# Patient Record
Sex: Male | Born: 1935 | Race: White | Hispanic: No | Marital: Single | Smoking: Never smoker
Health system: Southern US, Community
[De-identification: ages and names within clinical notes are randomized; demographics above are authoritative.]

## PROBLEM LIST (undated history)

## (undated) DIAGNOSIS — I10 Essential (primary) hypertension: Secondary | ICD-10-CM

## (undated) DIAGNOSIS — E785 Hyperlipidemia, unspecified: Secondary | ICD-10-CM

## (undated) DIAGNOSIS — I509 Heart failure, unspecified: Secondary | ICD-10-CM

---

## 2012-11-26 ENCOUNTER — Emergency Department: Payer: Self-pay | Admitting: Unknown Physician Specialty

## 2012-11-26 LAB — COMPREHENSIVE METABOLIC PANEL WITH GFR
Albumin: 3.4 g/dL
Alkaline Phosphatase: 125 U/L
Anion Gap: 6 — ABNORMAL LOW
BUN: 14 mg/dL
Bilirubin,Total: 0.2 mg/dL
Calcium, Total: 8.7 mg/dL
Chloride: 101 mmol/L
Co2: 30 mmol/L
Creatinine: 0.76 mg/dL
EGFR (African American): >60
EGFR (Non-African Amer.): >60
Glucose: 116 mg/dL — ABNORMAL HIGH
Osmolality: 275
Potassium: 3.4 mmol/L — ABNORMAL LOW
SGOT(AST): 34 U/L
SGPT (ALT): 27 U/L
Sodium: 137 mmol/L
Total Protein: 8.2 g/dL

## 2012-11-26 LAB — CBC
HGB: 10.5 g/dL — ABNORMAL LOW (ref 13.0–18.0)
MCH: 29 pg (ref 26.0–34.0)
MCV: 89 fL (ref 80–100)
RBC: 3.64 10*6/uL — ABNORMAL LOW (ref 4.40–5.90)
WBC: 3.5 10*3/uL — ABNORMAL LOW (ref 3.8–10.6)

## 2012-11-26 LAB — PROTIME-INR
INR: 1
Prothrombin Time: 13.3 s

## 2012-11-26 LAB — APTT: Activated PTT: 31.6 secs (ref 23.6–35.9)

## 2012-11-26 LAB — CK TOTAL AND CKMB (NOT AT ARMC)
CK, Total: 94 U/L
CK-MB: 1.5 ng/mL

## 2012-11-28 LAB — URINALYSIS, COMPLETE
Bacteria: NONE SEEN
Glucose,UR: NEGATIVE mg/dL (ref 0–75)
Leukocyte Esterase: NEGATIVE
Ph: 5 (ref 4.5–8.0)
Protein: NEGATIVE
Squamous Epithelial: 3
WBC UR: 3 /HPF (ref 0–5)

## 2013-07-12 ENCOUNTER — Ambulatory Visit: Payer: Self-pay | Admitting: Family Medicine

## 2014-02-10 ENCOUNTER — Ambulatory Visit: Payer: Self-pay | Admitting: Family Medicine

## 2014-02-21 ENCOUNTER — Ambulatory Visit: Payer: Self-pay | Admitting: Family Medicine

## 2014-03-28 ENCOUNTER — Emergency Department: Payer: Self-pay | Admitting: Emergency Medicine

## 2014-06-05 ENCOUNTER — Other Ambulatory Visit: Payer: Self-pay | Admitting: Urgent Care

## 2014-06-05 LAB — COMPREHENSIVE METABOLIC PANEL
ALT: 24 U/L
ANION GAP: 7 (ref 7–16)
AST: 26 U/L (ref 15–37)
Albumin: 3.4 g/dL (ref 3.4–5.0)
Alkaline Phosphatase: 157 U/L — ABNORMAL HIGH
BUN: 14 mg/dL (ref 7–18)
Bilirubin,Total: 0.3 mg/dL (ref 0.2–1.0)
CHLORIDE: 99 mmol/L (ref 98–107)
CREATININE: 0.97 mg/dL (ref 0.60–1.30)
Calcium, Total: 9 mg/dL (ref 8.5–10.1)
Co2: 32 mmol/L (ref 21–32)
EGFR (African American): >60
EGFR (Non-African Amer.): >60
Glucose: 171 mg/dL — ABNORMAL HIGH (ref 65–99)
Osmolality: 280 (ref 275–301)
Potassium: 3.7 mmol/L (ref 3.5–5.1)
Sodium: 138 mmol/L (ref 136–145)
TOTAL PROTEIN: 7.8 g/dL (ref 6.4–8.2)

## 2014-06-05 LAB — CBC WITH DIFFERENTIAL/PLATELET
BASOS ABS: 0 10*3/uL (ref 0.0–0.1)
BASOS PCT: 0.7 %
EOS PCT: 4.2 %
Eosinophil #: 0.2 10*3/uL (ref 0.0–0.7)
HCT: 38.5 % — ABNORMAL LOW (ref 40.0–52.0)
HGB: 12.3 g/dL — ABNORMAL LOW (ref 13.0–18.0)
LYMPHS ABS: 1.8 10*3/uL (ref 1.0–3.6)
LYMPHS PCT: 30.2 %
MCH: 29.8 pg (ref 26.0–34.0)
MCHC: 31.8 g/dL — AB (ref 32.0–36.0)
MCV: 94 fL (ref 80–100)
Monocyte #: 0.7 x10 3/mm (ref 0.2–1.0)
Monocyte %: 12.4 %
NEUTROS ABS: 3.1 10*3/uL (ref 1.4–6.5)
Neutrophil %: 52.5 %
Platelet: 227 10*3/uL (ref 150–440)
RBC: 4.11 10*6/uL — AB (ref 4.40–5.90)
RDW: 12.9 % (ref 11.5–14.5)
WBC: 6 10*3/uL (ref 3.8–10.6)

## 2014-06-05 LAB — TSH: Thyroid Stimulating Horm: 2.67 u[IU]/mL

## 2015-03-19 ENCOUNTER — Emergency Department
Admission: EM | Admit: 2015-03-19 | Discharge: 2015-03-20 | Disposition: A | Discharge status: Other Acute Inpatient Hospital | Payer: Commercial Managed Care - HMO | Attending: Emergency Medicine | Admitting: Emergency Medicine

## 2015-03-19 ENCOUNTER — Emergency Department: Payer: Commercial Managed Care - HMO

## 2015-03-19 DIAGNOSIS — S129XXA Fracture of neck, unspecified, initial encounter: Secondary | ICD-10-CM

## 2015-03-19 DIAGNOSIS — Z79899 Other long term (current) drug therapy: Secondary | ICD-10-CM | POA: Diagnosis not present

## 2015-03-19 DIAGNOSIS — Y9389 Activity, other specified: Secondary | ICD-10-CM | POA: Insufficient documentation

## 2015-03-19 DIAGNOSIS — S0990XA Unspecified injury of head, initial encounter: Secondary | ICD-10-CM | POA: Insufficient documentation

## 2015-03-19 DIAGNOSIS — Z7982 Long term (current) use of aspirin: Secondary | ICD-10-CM | POA: Insufficient documentation

## 2015-03-19 DIAGNOSIS — Y9289 Other specified places as the place of occurrence of the external cause: Secondary | ICD-10-CM | POA: Diagnosis not present

## 2015-03-19 DIAGNOSIS — Y998 Other external cause status: Secondary | ICD-10-CM | POA: Insufficient documentation

## 2015-03-19 DIAGNOSIS — R531 Weakness: Secondary | ICD-10-CM | POA: Diagnosis not present

## 2015-03-19 DIAGNOSIS — S12112A Nondisplaced Type II dens fracture, initial encounter for closed fracture: Secondary | ICD-10-CM | POA: Diagnosis not present

## 2015-03-19 DIAGNOSIS — W01198A Fall on same level from slipping, tripping and stumbling with subsequent striking against other object, initial encounter: Secondary | ICD-10-CM | POA: Diagnosis not present

## 2015-03-19 DIAGNOSIS — S00432A Contusion of left ear, initial encounter: Secondary | ICD-10-CM | POA: Diagnosis not present

## 2015-03-19 DIAGNOSIS — I10 Essential (primary) hypertension: Secondary | ICD-10-CM | POA: Insufficient documentation

## 2015-03-19 DIAGNOSIS — S199XXA Unspecified injury of neck, initial encounter: Secondary | ICD-10-CM | POA: Diagnosis present

## 2015-03-19 DIAGNOSIS — W19XXXA Unspecified fall, initial encounter: Secondary | ICD-10-CM

## 2015-03-19 NOTE — ED Notes (Signed)
Pt fell backwards onto the closet door, co head pain, neck pain, left ear with bruising and blood noted, and swelling to left side of neck.

## 2015-03-19 NOTE — ED Provider Notes (Signed)
[facility] Emergency Department Provider Note  ____________________________________________  Time seen: Approximately 11:22 PM  I have reviewed the triage vital signs and the nursing notes.   HISTORY  Chief Complaint Fall    HPI Steven Nolan is a 81 y.o. male who presents s/p mechanical fall approximately 8:30 PM. Patient lost control of his walker and fell backwards into his closet door, striking left head. Subsequently patient complained of neck pain to his daughter who brought patient to the ED for evaluation. Patient denies LOC. Complains of6/10 posterior neck and head pain. Denies numbness, tingling, incontinence, weakness.   Past medical history Hypertension Hyperlipidemia CVA w/ residual left side deficits    No past surgical history on file.  Current Outpatient Rx  Name  Route  Sig  Dispense  Refill  . aspirin EC 81 MG tablet   Oral   Take 81 mg by mouth daily.         . bifidobacterium infantis (ALIGN) capsule   Oral   Take 1 capsule by mouth daily.         . furosemide (LASIX) 20 MG tablet   Oral   Take 20 mg by mouth daily.         Marland Kitchen. lisinopril (PRINIVIL,ZESTRIL) 20 MG tablet   Oral   Take 20 mg by mouth daily.         Marland Kitchen. lovastatin (MEVACOR) 10 MG tablet   Oral   Take 10 mg by mouth at bedtime.         . metoprolol tartrate (LOPRESSOR) 25 MG tablet   Oral   Take 25 mg by mouth 2 (two) times daily.         . potassium chloride (KLOR-CON) 8 MEQ tablet   Oral   Take 8 mEq by mouth daily.           Allergies Review of patient's allergies indicates no known allergies.  No family history on file.  Social History History  Substance Use Topics  . Smoking status: Not on file  . Smokeless tobacco: Not on file  . Alcohol Use: Not on file  Nonsmoker Former EtOH  Review of Systems Constitutional: No fever/chills Eyes: No visual changes. ENT: Positive for left ear pain. Positive for neck pain. No  sore throat.  Cardiovascular: Denies chest pain. Respiratory: Denies shortness of breath. Gastrointestinal: No abdominal pain.  No nausea, no vomiting.  No diarrhea.  No constipation. Genitourinary: Negative for dysuria. Musculoskeletal: Negative for back pain. Skin: Negative for rash. Neurological: Positive for head pain. Negative for focal weakness or numbness.  10-point ROS otherwise negative.  ____________________________________________   PHYSICAL EXAM:  VITAL SIGNS: ED Triage Vitals  Enc Vitals Group     BP 03/19/15 2148 168/60 mmHg     Pulse Rate 03/19/15 2148 63     Resp 03/19/15 2148 18     Temp 03/19/15 2148 97.6 F (36.4 C)     Temp Source 03/19/15 2148 Oral     SpO2 03/19/15 2148 100 %     Weight 03/19/15 2148 165 lb (74.844 kg)     Height 03/19/15 2148 5\' 10"  (1.778 m)     Head Cir --      Peak Flow --      Pain Score 03/19/15 2148 5     Pain Loc --      Pain Edu? --      Excl. in GC? --     Constitutional: Alert and oriented. Well  appearing and in no acute distress. Eyes: Conjunctivae are normal. PERRL. EOMI. Head: Left ear abrasion with hematoma. Nose: No congestion/rhinnorhea. Mouth/Throat: Mucous membranes are moist.  Oropharynx non-erythematous. Neck: No stridor. Cervical spine tenderness to palpation. Midline trachea. Cardiovascular: Normal rate, regular rhythm. Grossly normal heart sounds.  Good peripheral circulation. Respiratory: Normal respiratory effort.  No retractions. Lungs CTAB. Gastrointestinal: Soft and nontender. No distention. No abdominal bruits. No CVA tenderness. Musculoskeletal: No lower extremity tenderness nor edema.  No joint effusions. Neurologic:  Normal speech and language. No gross focal neurologic deficits are appreciated; patient has baseline left lower leg 4/5 motor strength. Speech is normal. Gait not tested. Skin:  Skin is warm, dry and intact. No rash noted. Psychiatric: Mood and affect are normal. Speech and behavior  are normal.  ____________________________________________   LABS (all labs ordered are listed, but only abnormal results are displayed)  Labs Reviewed - No data to display ____________________________________________  EKG  None ____________________________________________  RADIOLOGY  CT head and cervical spine without contrast interpreted per Dr. Grace Isaac: Cervical spine CT Impression:  1. Acute nondisplaced type 2 odontoid fracture without associated retropulsion. 2. 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 3. Moderate to severe multilevel cervical spine DDD, worse at C5-C6. 4. Extensive calcified plaque within the bilateral carotid bulbs. Further evaluation with nonemergent carotid Doppler ultrasound could be performed as clinically indicated. Head CT Impression:  1. No acute intracranial process. 2. Advanced microvascular ischemic disease with old large right basal ganglia infarct.   ____________________________________________   PROCEDURES  Procedure(s) performed: None  Critical Care performed: No  ____________________________________________   INITIAL IMPRESSION / ASSESSMENT AND PLAN / ED COURSE  Pertinent labs & imaging results that were available during my care of the patient were reviewed by me and considered in my medical decision making (see chart for details).  81 year old male s/p mechanical fall with nondisplaced type II odontoid fracture without neurological deficits. Call placed to [facility] transfer center; patient accepted to the ED by Dr. Matilde Haymaker. Patient and daughter updated and agree with plan.  ----------------------------------------- 1:08 AM on 03/20/2015 -----------------------------------------  Patient was log-rolled onto long spine board for transport. Moving all extremities; no complaints of numbness or tingling at discharge. ____________________________________________   FINAL CLINICAL IMPRESSION(S) / ED DIAGNOSES  Final  diagnoses:  Fall, initial encounter  Neck fracture, initial encounter  Ear hematoma, left, initial encounter      Irean Hong, MD 03/20/15 0109

## 2015-03-20 ENCOUNTER — Other Ambulatory Visit: Payer: Self-pay

## 2015-03-20 ENCOUNTER — Emergency Department
Admission: EM | Admit: 2015-03-20 | Discharge: 2015-03-20 | Disposition: A | Discharge status: Home / Self Care | Payer: Commercial Managed Care - HMO | Attending: Emergency Medicine | Admitting: Emergency Medicine

## 2015-03-20 DIAGNOSIS — R200 Anesthesia of skin: Secondary | ICD-10-CM | POA: Diagnosis not present

## 2015-03-20 DIAGNOSIS — Z7982 Long term (current) use of aspirin: Secondary | ICD-10-CM | POA: Insufficient documentation

## 2015-03-20 DIAGNOSIS — Z79899 Other long term (current) drug therapy: Secondary | ICD-10-CM | POA: Insufficient documentation

## 2015-03-20 DIAGNOSIS — I10 Essential (primary) hypertension: Secondary | ICD-10-CM | POA: Diagnosis not present

## 2015-03-20 DIAGNOSIS — R531 Weakness: Secondary | ICD-10-CM | POA: Diagnosis present

## 2015-03-20 HISTORY — DX: Essential (primary) hypertension: I10

## 2015-03-20 HISTORY — DX: Heart failure, unspecified: I50.9

## 2015-03-20 HISTORY — DX: Hyperlipidemia, unspecified: E78.5

## 2015-03-20 LAB — BASIC METABOLIC PANEL
Anion gap: 10 (ref 5–15)
BUN: 11 mg/dL (ref 6–20)
CO2: 29 mmol/L (ref 22–32)
CREATININE: 0.97 mg/dL (ref 0.61–1.24)
Calcium: 9.4 mg/dL (ref 8.9–10.3)
Chloride: 97 mmol/L — ABNORMAL LOW (ref 101–111)
GFR calc non Af Amer: >60 mL/min (ref >60)
Glucose, Bld: 150 mg/dL — ABNORMAL HIGH (ref 65–99)
POTASSIUM: 4.1 mmol/L (ref 3.5–5.1)
SODIUM: 136 mmol/L (ref 135–145)

## 2015-03-20 LAB — CBC
HEMATOCRIT: 37.3 % — AB (ref 40.0–52.0)
Hemoglobin: 12.2 g/dL — ABNORMAL LOW (ref 13.0–18.0)
MCH: 30.3 pg (ref 26.0–34.0)
MCHC: 32.8 g/dL (ref 32.0–36.0)
MCV: 92.3 fL (ref 80.0–100.0)
Platelets: 233 10*3/uL (ref 150–440)
RBC: 4.04 MIL/uL — ABNORMAL LOW (ref 4.40–5.90)
RDW: 12.7 % (ref 11.5–14.5)
WBC: 7.9 10*3/uL (ref 3.8–10.6)

## 2015-03-20 LAB — TROPONIN I
Troponin I: 0.03 ng/mL (ref <0.031)
Troponin I: <0.03 ng/mL (ref <0.031)

## 2015-03-20 NOTE — ED Notes (Signed)
Pt awaiting PT consult

## 2015-03-20 NOTE — Evaluation (Signed)
Physical Therapy Evaluation Patient Details Name: MOHANNAD OLIVERO MRN: [id number] DOB: Mar 20, 1932 Today's Date: 03/20/2015   History of Present Illness  presented to ER after acute onset of generalized weakness, diaphoresis and numbness x15 minutes.  Of note, patient seen previous day (5/26) in ER status post fall with non-displaced, type-2 odontoid fracture (no retropulsion); transferred to [facility] and discharged home with hard-cervical collar this AM (around 0630).    Clinical Impression  Upon evaluation, patient alert and oriented to all basic information; hyperverbal, requiring intermittent redirection to task.  Demonstrates chronic L UE/LE hemiparesis from previous CVA, unchanged with this event, and grossly [facility] for basic transfers/mobility.  Currently requiring min/mod assist for bed mobility; cga/min assist for sit/stand, basic transfers and gait (30') with RW.  No overt buckling or LOB. Vitals stable and WFL throughout session; no orthostasis.  Denies pain.  Hard cervical collar donned throughout session. Would benefit from skilled PT to address above deficits and promote optimal return to PLOF; Recommend transition to home HHPT/OT upon discharge from acute hospitalization. Patient/family aware of recommendations and in agreement with plan.     Follow Up Recommendations Home health PT [facility])    Equipment Recommendations       Recommendations for Other Services OT consult     Precautions / Restrictions Precautions Precautions: Fall Required Braces or Orthoses: Cervical Brace Cervical Brace: Hard collar;At all times Restrictions Weight Bearing Restrictions: No      Mobility  Bed Mobility Overal bed mobility: Needs Assistance Bed Mobility: Supine to Sit;Sit to Supine     Supine to sit: Min assist Sit to supine: Mod assist      Transfers Overall transfer level: Needs assistance Equipment used: Rolling walker (2 wheeled) Transfers: Sit to/from Stand Sit to Stand: Min  guard;Min assist         General transfer comment: tends to maintain L LE slightly anterior to BOS  Ambulation/Gait Ambulation/Gait assistance: Min guard;Min assist Ambulation Distance (Feet): 30 Feet Assistive device: Rolling walker (2 wheeled)       General Gait Details: 3-point, step to gait pattern; decreased stance time/weight acceptance to L LE.  Decreased L LE heel strike/toe off.  Good UE grasp on RW. No overt buckling/LOB.  Stairs            Wheelchair Mobility    Modified Rankin (Stroke Patients Only)       Balance Overall balance assessment: Needs assistance Sitting-balance support: No upper extremity supported;Feet supported Sitting balance-Leahy Scale: Good     Standing balance support: Bilateral upper extremity supported Standing balance-Leahy Scale: Fair                               Pertinent Vitals/Pain Pain Assessment: No/denies pain    Home Living Family/patient expects to be discharged to:: Private residence Living Arrangements: Children Available Help at Discharge: Family Type of Home: Apartment Home Access: Level entry     Home Layout: One level Home Equipment: Environmental consultant - 2 wheels;Tub bench;Grab bars - tub/shower      Prior Function Level of Independence: Independent with assistive device(s)               Hand Dominance   Dominant Hand: Left    Extremity/Trunk Assessment   Upper Extremity Assessment:  (R UE grossly WFL for strength and ROM; L UE globally at least 3 to 4/5, delayed speed of activation/coordination.  Denies sensory deficit.  All shoulder elevation limited  to shoulder-height due to cervical injury.)           Lower Extremity Assessment:  (R LE grossly WFL for strength and ROM.  L LE grossly 4/5 throughout.  Denies sensory defici; + Babinski L LE.)         Communication   Communication: No difficulties  Cognition Arousal/Alertness: Awake/alert Behavior During Therapy: WFL for tasks  assessed/performed Overall Cognitive Status: Within Functional Limits for tasks assessed                      General Comments      Exercises        Assessment/Plan    PT Assessment Patient needs continued PT services  PT Diagnosis Difficulty walking;Generalized weakness   PT Problem List Decreased strength;Decreased range of motion;Decreased activity tolerance;Decreased balance;Decreased mobility;Decreased coordination;Decreased cognition;Decreased knowledge of use of DME;Decreased safety awareness;Decreased knowledge of precautions;Pain  PT Treatment Interventions DME instruction;Gait training;Stair training;Functional mobility training;Therapeutic activities;Therapeutic exercise;Balance training;Patient/family education   PT Goals (Current goals can be found in the Care Plan section) Acute Rehab PT Goals Patient Stated Goal: "to go back home" PT Goal Formulation: With patient/family Time For Goal Achievement: 04/03/15 Potential to Achieve Goals: Good    Frequency Min 2X/week   Barriers to discharge        Co-evaluation               End of Session Equipment Utilized During Treatment: Gait belt Activity Tolerance: Patient tolerated treatment well Patient left: in bed;with family/visitor present Nurse Communication: Mobility status    Functional Limitation: Mobility: Walking and moving around Mobility: Walking and Moving Around Current Status [phone]): At least 20 percent but less than 40 percent impaired, limited or restricted Mobility: Walking and Moving Around Goal Status [phone](G8979): At least 1 percent but less than 20 percent impaired, limited or restricted    Time: [id number] PT Time Calculation (min) (ACUTE ONLY): 32 min   Charges:   PT Evaluation $Initial PT Evaluation Tier I: 1 Procedure     PT G Codes:   PT G-Codes **NOT FOR INPATIENT CLASS** Functional Limitation: Mobility: Walking and moving around Mobility: Walking and Moving Around Current  Status [id number]): At least 20 percent but less than 40 percent impaired, limited or restricted Mobility: Walking and Moving Around Goal Status [phone]([id number]): At least 1 percent but less than 20 percent impaired, limited or restricted    Nikoleta Dady H. Manson PasseyBrown, PT, DPT 03/20/2015, 2:23 PM [phone]

## 2015-03-20 NOTE — Care Management Note (Signed)
Case Management Note  Patient Details  Name: Steven Nolan MRN: [id number] Date of Birth: 28-Jul-1932  Subjective/Objective:                    Action/Plan:Spoke to daughter Vena Austrialeanor in person at bedside. [facility] and was on hold for   Too long and hung up. She does have the number for a case manager she has worked with before, and she is going to consult with her about other  services  The pt may benefit from.   Expected Discharge Date:                  Expected Discharge Plan:     In-House Referral:     Discharge planning Services     Post Acute Care Choice:    Choice offered to:     DME Arranged:    DME Agency:     HH Arranged:    HH Agency:     Status of Service:     Medicare Important Message Given:    Date Medicare IM Given:    Medicare IM give by:    Date Additional Medicare IM Given:    Additional Medicare Important Message give by:     If discussed at Long Length of Stay Meetings, dates discussed:    Additional Comments:  Berna BueCheryl Tamia Dial, RN 03/20/2015, 2:49 PM

## 2015-03-20 NOTE — ED Notes (Signed)
Pt given drink and snack.

## 2015-03-20 NOTE — ED Notes (Signed)
Pt seen in ER yesterday after mechanical fall, dx with cervical [facility]x [facility]. While at home, pt in bathroom and had sudden onset of diaphoresis, chest "numbness" and weakness. Pt could not move without assistance. Sx lasted approx 15 minutes and have subsided at this time. Pt brought in by daughter who was with patient when sx occurred. Pt wearing custom c collar that he was instructed to wear for 6 weeks at this time. Pt alert and oriented X4, active, cooperative, pt in NAD. RR even and unlabored, color WNL.  Pt at baseline per family.

## 2015-03-20 NOTE — ED Notes (Signed)
MD at bedside. 

## 2015-03-20 NOTE — Discharge Instructions (Signed)
Weakness Weakness is a lack of strength. You may feel weak all over your body or just in one part of your body. Weakness can be serious. In some cases, you may need more medical tests. HOME CARE  Rest.  Eat a well-balanced diet.  Try to exercise every day.  Only take medicines as told by your doctor. GET HELP RIGHT AWAY IF:   You cannot do your normal daily activities.  You cannot walk up and down stairs, or you feel very tired when you do so.  You have shortness of breath or chest pain.  You have trouble moving parts of your body.  You have weakness in only one body part or on only one side of the body.  You have a fever.  You have trouble speaking or swallowing.  You cannot control when you pee (urinate) or poop (bowel movement).  You have black or bloody throw up (vomit) or poop.  Your weakness gets worse or spreads to other body parts.  You have new aches or pains. MAKE SURE YOU:   Understand these instructions.  Will watch your condition.  Will get help right away if you are not doing well or get worse. Document Released: 09/22/2008 Document Revised: 04/10/2012 Document Reviewed: 12/09/2011 [facility] Patient Information 2015 [employer] [place]. This information is not intended to replace advice given to you by your health care provider. Make sure you discuss any questions you have with your health care provider.     Please return to the emergency department for any chest pain, tightness, pressure, shortness of breath/difficulty breathing, or any other personally concerning symptom. Please follow up with her primary care doctor soon as possible regarding today's ER visit. Please follow-up with your primary care doctor regarding home health care.

## 2015-03-20 NOTE — Care Management Note (Signed)
Case Management Note  Patient Details  Name: Steven Nolan MRN: [id number] Date of Birth: 08/28/1932  Subjective/Objective:   Pt. Here after being seen yesterday, [facility]. Daughter wants home health to assist at home per Dr.Paduchowski.  I have contacted [facility] via Tiffany -[phone]; to arrange home PT, and the MD has agreed to complete the face to face and enter orders for home PT.                  Action/Plan:   Expected Discharge Date:                  Expected Discharge Plan:     In-House Referral:     Discharge planning Services     Post Acute Care Choice:    Choice offered to:     DME Arranged:    DME Agency:     HH Arranged:    HH Agency:     Status of Service:     Medicare Important Message Given:    Date Medicare IM Given:    Medicare IM give by:    Date Additional Medicare IM Given:    Additional Medicare Important Message give by:     If discussed at Long Length of Stay Meetings, dates discussed:    Additional Comments:  Berna BueCheryl Haven Pylant, RN 03/20/2015, 10:42 AM

## 2015-03-20 NOTE — ED Notes (Signed)
Pt states he fell and was seen here yesterday and dx with cervical fx and [facility], daughter states they called her at 6am and he was discharged from there..states around 8-830am this morning he had sudden onset generalized weakness, diaphoresis and numbness to the chest area that lasted about 15 min.Marland Kitchen.denies sx at present..Marland Kitchen

## 2015-03-20 NOTE — ED Notes (Signed)
Pt alert and oriented X4, active, cooperative, pt in NAD. RR even and unlabored, color WNL.  Pt family informed to return with patient if any life threatening symptoms occur. Following up with Home Care

## 2015-03-20 NOTE — ED Provider Notes (Signed)
[facility] Emergency Department Provider Note  Time seen: 10:41 AM  I have reviewed the triage vital signs and the nursing notes.   HISTORY  Chief Complaint Weakness    HPI Steven Nolan is a 81 y.o. male with a past medical history of hypertension, hyperlipidemia, CVA with some left-sided weakness, presents the emergency department for weakness/chest numbness. According to the daughter the patient was seen here yesterday morning after a fall while walking with his walker. The patient was diagnosed with a cervical neck fracture and [facility] was seen and evaluated, a [place] J collar was placed and the patient was discharged at approximately 6 AM this morning. Daughter states when she got the patient home he got up to walk and acutely became nauseated and felt weak along with some chest numbness. The episode resolved after approximately 1-2 minutes, but the daughter was concerned so she brought him back to the emergency department for evaluation. In the emergency department the patient denies any current symptoms, denies ever having chest pain. States he has not eaten for approximately 24 hours he thinks it is related to that. Denies history of heart attacks in the past.    Past Medical History  Diagnosis Date  . Hypertension   . CHF (congestive heart failure)   . Hyperlipemia     There are no active problems to display for this patient.   History reviewed. No pertinent past surgical history.  Current Outpatient Rx  Name  Route  Sig  Dispense  Refill  . aspirin EC 81 MG tablet   Oral   Take 81 mg by mouth daily.         . bifidobacterium infantis (ALIGN) capsule   Oral   Take 1 capsule by mouth daily.         . furosemide (LASIX) 20 MG tablet   Oral   Take 20 mg by mouth daily.         Marland Kitchen. lisinopril (PRINIVIL,ZESTRIL) 20 MG tablet   Oral   Take 20 mg by mouth daily.         Marland Kitchen.  lovastatin (MEVACOR) 20 MG tablet   Oral   Take 20 mg by mouth at bedtime.         . metoprolol tartrate (LOPRESSOR) 25 MG tablet   Oral   Take 25 mg by mouth 2 (two) times daily.         . potassium chloride (KLOR-CON) 8 MEQ tablet   Oral   Take 8 mEq by mouth daily.           Allergies Review of patient's allergies indicates no known allergies.  No family history on file.  Social History History  Substance Use Topics  . Smoking status: Never Smoker   . Smokeless tobacco: Never Used  . Alcohol Use: No    Review of Systems Constitutional: Negative for fever. Cardiovascular: Negative for chest pain. Positive chest "numbness " Respiratory: Negative for shortness of breath. Gastrointestinal: Negative for abdominal pain Musculoskeletal: Negative for back pain. Mild neck pain. Skin: Negative for rash. Neurological: Negative for headaches, or new focal weakness or numbness.  10-point ROS otherwise negative.  ____________________________________________   PHYSICAL EXAM:  VITAL SIGNS: ED Triage Vitals  Enc Vitals Group     BP 03/20/15 0932 136/60 mmHg     Pulse Rate 03/20/15 1000 70     Resp 03/20/15 0932 18     Temp  03/20/15 0932 97.8 F (36.6 C)     Temp Source 03/20/15 0932 Oral     SpO2 03/20/15 0932 98 %     Weight 03/20/15 0932 170 lb (77.111 kg)     Height 03/20/15 0932  (1.778 m)     Head Cir --      Peak Flow --      Pain Score 03/20/15 0933 0     Pain Loc --      Pain Edu? --      Excl. in GC? --     Constitutional: Alert and oriented. Well appearing and in no distress. ENT   Mouth/Throat: Mucous membranes are moist. [place] J collar present. Cardiovascular: Normal rate, regular rhythm. 2/6 systolic murmur. Respiratory: Normal respiratory effort without tachypnea nor retractions. Breath sounds are clear Gastrointestinal: Soft and nontender. No distention.   Musculoskeletal: Nontender with normal range of motion in all extremities.  Mild pedal edema bilaterally, nontender. Neurologic:  Normal speech and language. No gross focal neurologic deficits Skin:  Skin is warm, dry and intact. Recently sutured laceration to left ear. Psychiatric: Mood and affect are normal. Speech and behavior are normal.   ____________________________________________    EKG  EKG reviewed and interpreted by myself shows a junctional rhythm at 67 bpm, widened QRS, normal axis, nonspecific ST changes are noted.  ____________________________________________    INITIAL IMPRESSION / ASSESSMENT AND PLAN / ED COURSE  Pertinent labs & imaging results that were available during my care of the patient were reviewed by me and considered in my medical decision making (see chart for details).  Patient with chest numbness, weakness/nausea which lasted approximately 1-2 minutes upon standing upon arriving home. This occurred around 8 AM. We will check labs and monitor in the emergency department. Currently patient denies any symptoms states he feels normal, feels well, his only complaint is that he is hungry. The daughter who is now the patient's caretaker also states she is going to have difficulty taking care of the patient at home and was wondering what her options are as far as home health aides.  ----------------------------------------- 1:15 PM on 03/20/2015 -----------------------------------------  Repeat troponin is negative. Patient's labs are largely within normal limits. Patient states he feels much better after eating. We were able to arrange physical therapy with the help of case management. We have discussed with the patient's daughter arranging home health care which she will pursue via his primary care physician. We will discharge home at this time. ____________________________________________   FINAL CLINICAL IMPRESSION(S) / ED DIAGNOSES  Weakness   Minna Antis, MD 03/20/15 1316

## 2015-03-27 ENCOUNTER — Ambulatory Visit: Payer: Self-pay | Admitting: Family Medicine

## 2015-04-07 ENCOUNTER — Encounter: Payer: Self-pay | Admitting: Family Medicine

## 2015-04-07 ENCOUNTER — Ambulatory Visit (INDEPENDENT_AMBULATORY_CARE_PROVIDER_SITE_OTHER): Payer: Commercial Managed Care - HMO | Admitting: Family Medicine

## 2015-04-07 VITALS — BP 120/78 | HR 95 | Resp 16 | Ht 67.5 in | Wt 171.2 lb

## 2015-04-07 DIAGNOSIS — T50905A Adverse effect of unspecified drugs, medicaments and biological substances, initial encounter: Secondary | ICD-10-CM

## 2015-04-07 DIAGNOSIS — E876 Hypokalemia: Secondary | ICD-10-CM | POA: Insufficient documentation

## 2015-04-07 DIAGNOSIS — I2510 Atherosclerotic heart disease of native coronary artery without angina pectoris: Secondary | ICD-10-CM | POA: Insufficient documentation

## 2015-04-07 DIAGNOSIS — I739 Peripheral vascular disease, unspecified: Secondary | ICD-10-CM | POA: Insufficient documentation

## 2015-04-07 DIAGNOSIS — S129XXA Fracture of neck, unspecified, initial encounter: Secondary | ICD-10-CM | POA: Diagnosis not present

## 2015-04-07 DIAGNOSIS — K5904 Chronic idiopathic constipation: Secondary | ICD-10-CM | POA: Insufficient documentation

## 2015-04-07 DIAGNOSIS — I10 Essential (primary) hypertension: Secondary | ICD-10-CM | POA: Insufficient documentation

## 2015-04-07 DIAGNOSIS — E785 Hyperlipidemia, unspecified: Secondary | ICD-10-CM | POA: Insufficient documentation

## 2015-04-07 NOTE — Progress Notes (Signed)
Name: Steven Nolan   MRN: [id number]    DOB: [birth year]   Date:04/07/2015       Progress Note  Subjective  Chief Complaint  Chief Complaint  Patient presents with  . Follow-up    Patient is moving to [place] at the end of this week    HPI  Pt. Is here for follow up after a fall at his house on May 26th, after which he was taken to [facility] and diagnosed with cervical fracture. He was transferred to [facility] from [facility] and is being followed by a spine specialist. He is currently in a neck brace and will remain so for 10 weeks. He currently has no pain. He is working with physical therapy to walk and build endurance.  Past Medical History  Diagnosis Date  . Hypertension   . CHF (congestive heart failure)   . Hyperlipemia     History reviewed. No pertinent past surgical history.  Family History  Problem Relation Age of Onset  . Family history unknown: Yes    History   Social History  . Marital Status: Single    Spouse Name: N/A  . Number of Children: N/A  . Years of Education: N/A   Occupational History  . Not on file.   Social History Main Topics  . Smoking status: Never Smoker   . Smokeless tobacco: Never Used  . Alcohol Use: 7.2 oz/week    12 Cans of beer per week  . Drug Use: No  . Sexual Activity: Not Currently   Other Topics Concern  . Not on file   Social History Narrative     Current outpatient prescriptions:  .  aspirin 81 MG tablet, Take 1 tablet by mouth., Disp: , Rfl:  .  bifidobacterium infantis (ALIGN) capsule, Take 1 capsule by mouth daily., Disp: , Rfl:  .  furosemide (LASIX) 40 MG tablet, Take 1.5 tablets by mouth daily., Disp: , Rfl:  .  lisinopril (PRINIVIL,ZESTRIL) 20 MG tablet, Take 1 tablet by mouth daily., Disp: , Rfl:  .  lovastatin (MEVACOR) 40 MG tablet, Take 1 tablet by mouth daily., Disp: , Rfl:  .  metoprolol tartrate (LOPRESSOR) 25 MG tablet, Take 1 tablet by mouth 2 (two) times daily., Disp: , Rfl:  .  MULTIPLE VITAMIN PO, Take 1  tablet by mouth daily., Disp: , Rfl:  .  pentoxifylline (TRENTAL) 400 MG CR tablet, Take 1 tablet by mouth daily., Disp: , Rfl:  .  potassium chloride (KLOR-CON) 8 MEQ tablet, Take 1 tablet by mouth daily., Disp: , Rfl:   No Known Allergies   Review of Systems  Musculoskeletal: Negative for back pain and neck pain.  Neurological: Negative for dizziness, tingling, tremors and sensory change.      Objective  Filed Vitals:   04/07/15 1417  BP: 120/78  Pulse: 95  Resp: 16  Height: 5' 7.5" (1.715 m)  Weight: 171 lb 3.2 oz (77.656 kg)  SpO2: 96%    Physical Exam  Constitutional: He is oriented to person, place, and time and well-developed, well-nourished, and in no distress.  Pt. In cervical collar.  Neck:  Pt. In cervical collar  Cardiovascular: Normal rate and regular rhythm.   Pulmonary/Chest: Effort normal and breath sounds normal.  Musculoskeletal: He exhibits edema (2+ pedal edema).  Neurological: He is alert and oriented to person, place, and time.  Nursing note and vitals reviewed.        Assessment & Plan 1. Cervical spine fracture, initial encounter Patient  being followed by spine surgery at [facility]. He is to remain in a cervical collar for 10 weeks, at which time the surgeons will decide if he needs surgery based on the progression of healing. He is currently in no pain and appears comfortable. He is moving to [place] this coming weekend and is going to establish care with a primary care provider in [place].   Oddie Kuhlmann [facility] 04/07/2015 2:38 PM

## 2015-04-22 IMAGING — US ABDOMEN ULTRASOUND LIMITED
1 series · 14 of 25 positions shown · non-contrast
Comparison: none

REASON FOR EXAM: Elevated alkaline phosphate level
COMMENTS:

[Series 1: abdomen ultrasound limited · 0.26mm/px · 14 of 52 slices shown]
[im 1/52]
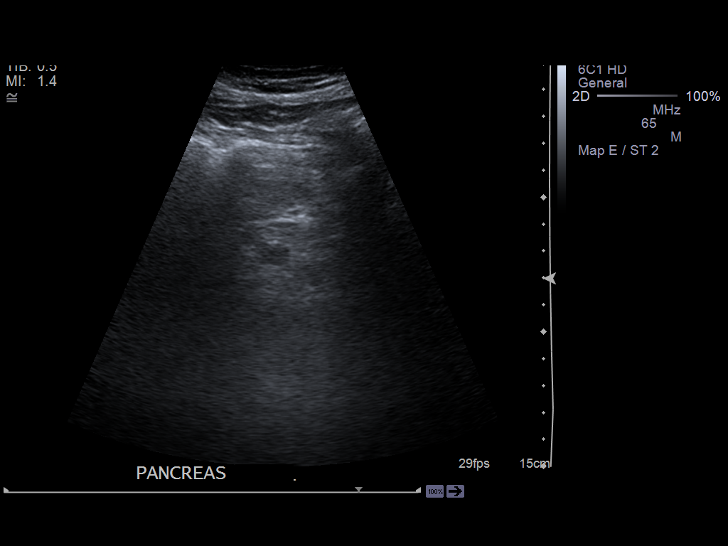
[im 5/52]
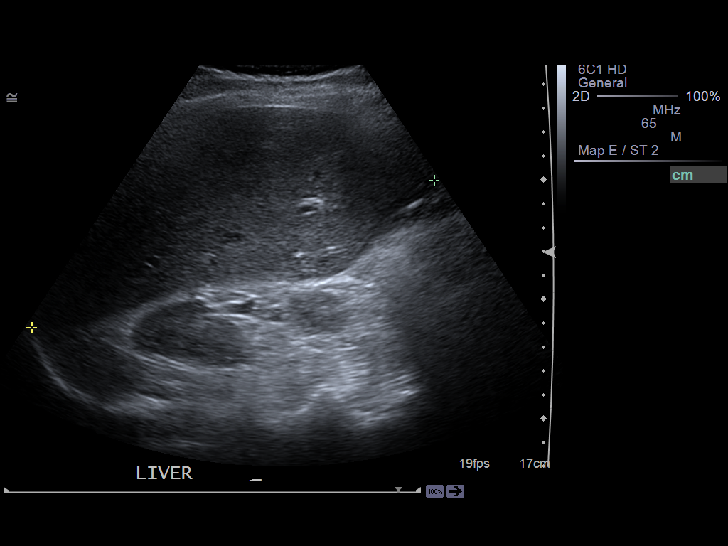
[im 9/52]
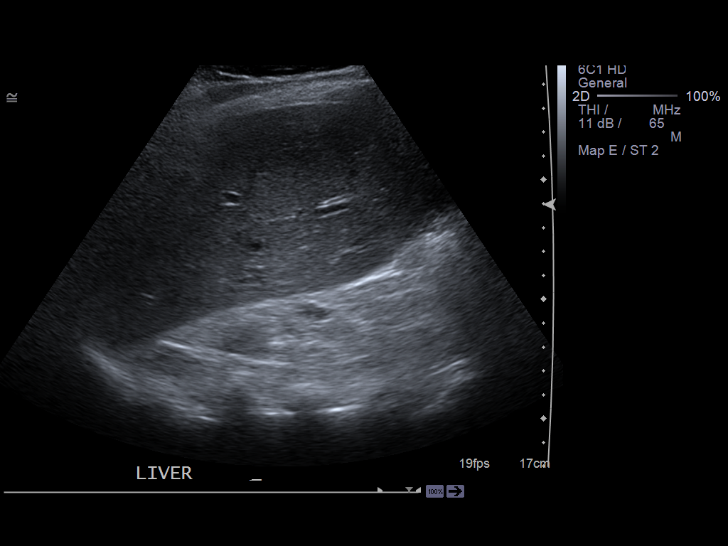
[im 13/52]
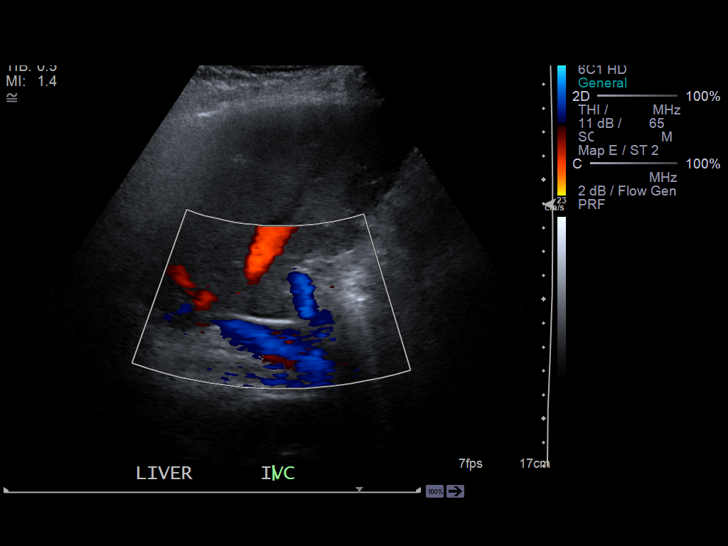
[im 18/52]
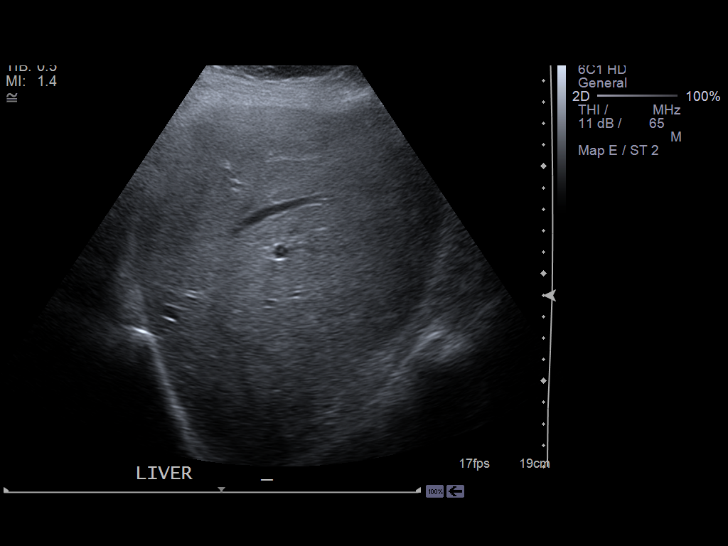
[im 20/52]
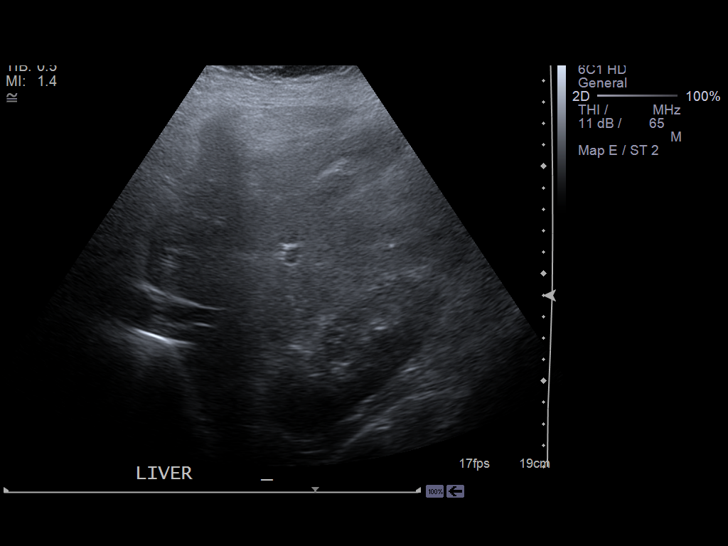
[im 24/52]
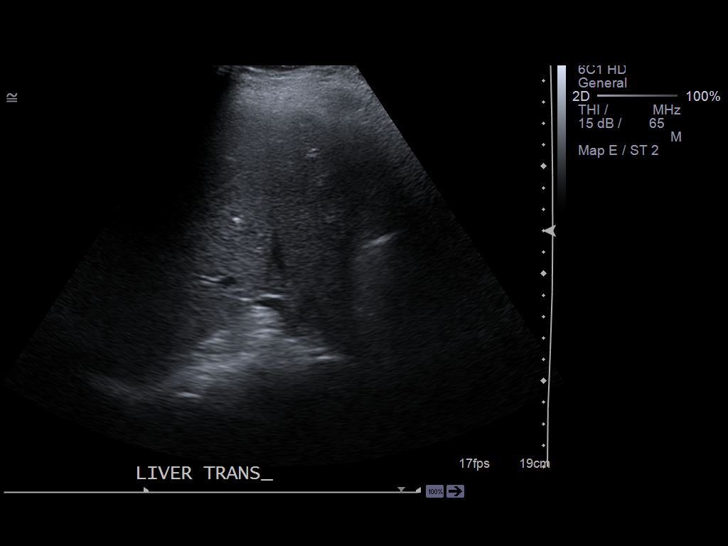
[im 28/52]
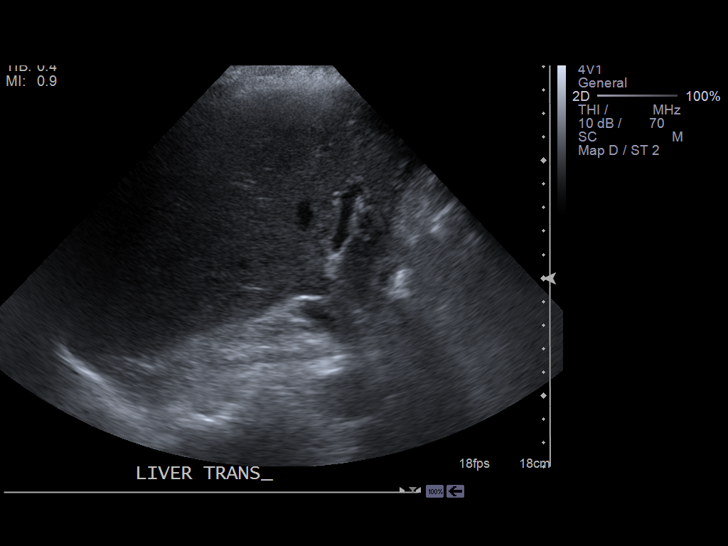
[im 32/52]
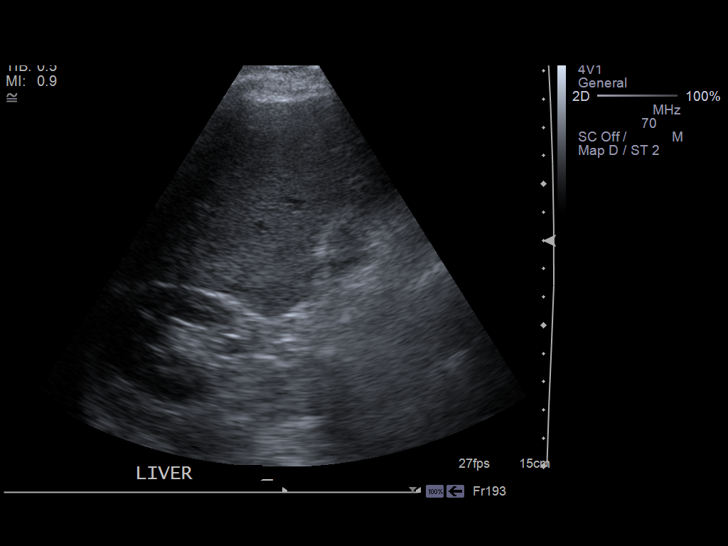
[im 35/52]
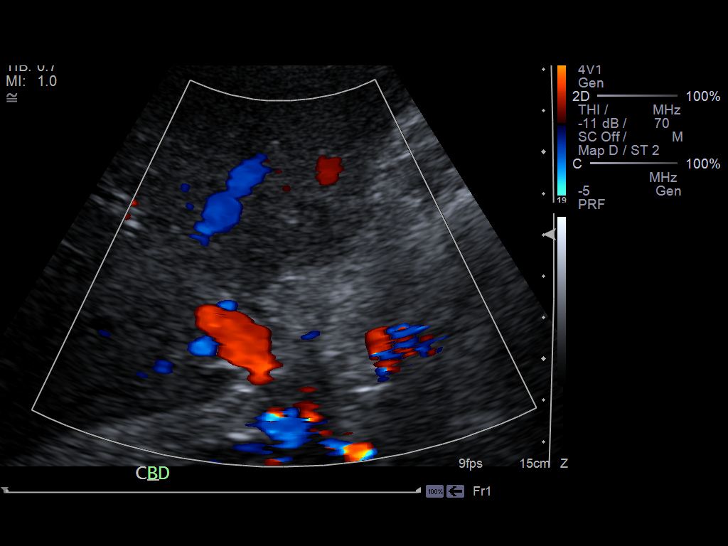
[im 39/52]
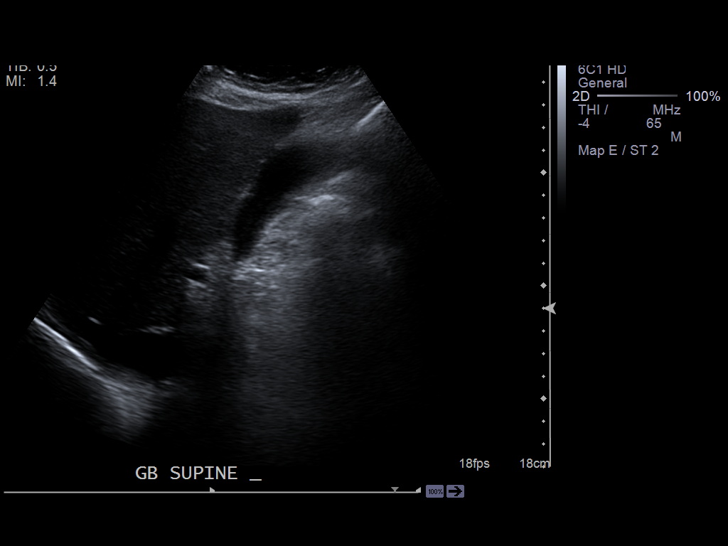
[im 43/52]
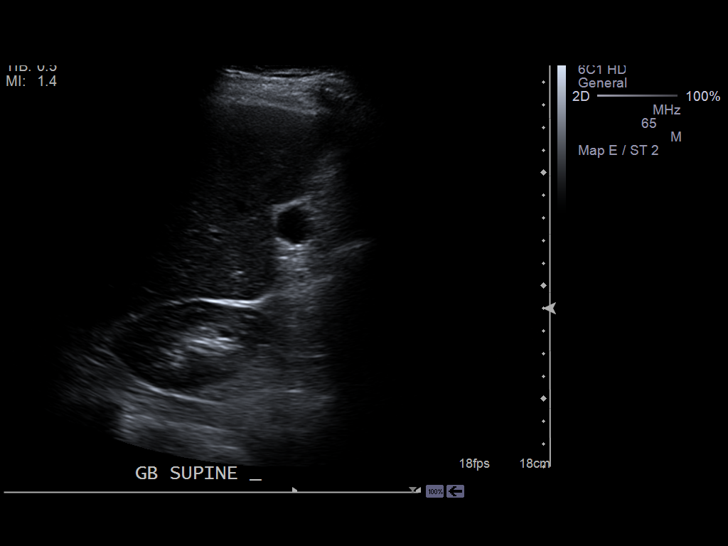
[im 47/52]
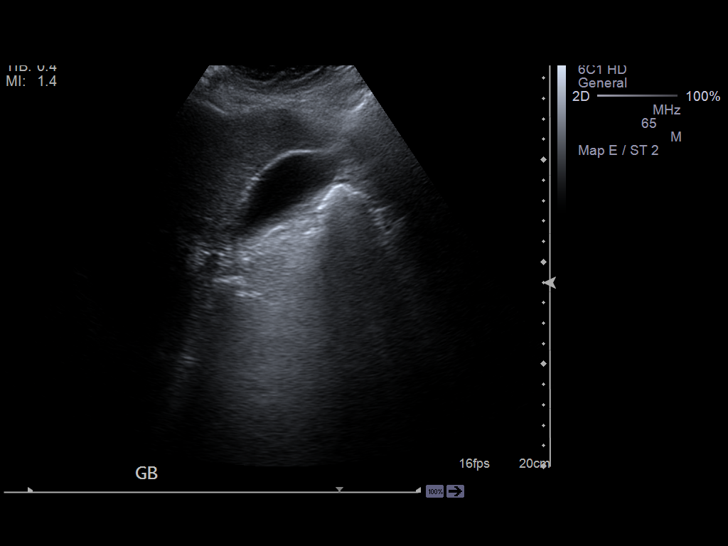
[im 52/52]
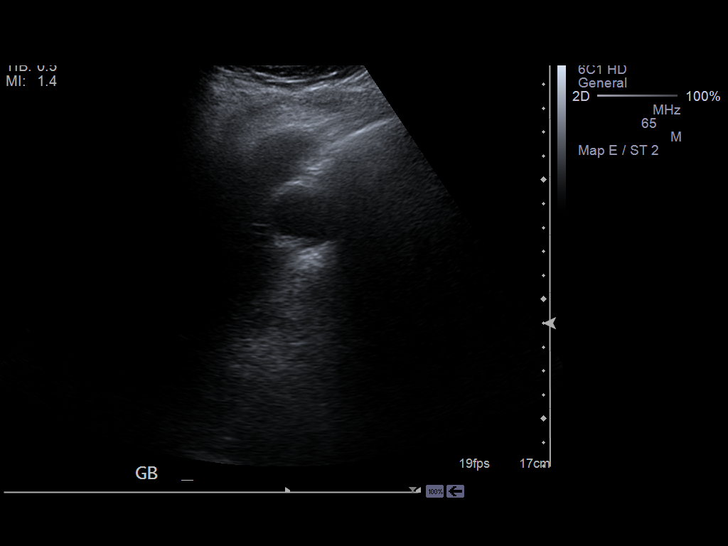

[14 of 25 positions shown; findings below may reference images not displayed]

PROCEDURE:     US  - US ABDOMEN LIMITED SURVEY  - July 12, 2013  [DATE]

RESULT:     Limited right upper quadrant abdominal ultrasound is performed.
There is very limited visualization of the pancreas because of overlying
bowel gas. The liver length is 17.94 cm. The hepatic echotexture is normal
without ductal dilation or mass. No ascites is evident. The portal venous
flow is normal. The common bile duct proximally is 2.4 mm in diameter. The
gallbladder is fluid-filled without stones or sludge. The wall thickness is
normal measuring 1.6 mm. There is a negative sonographic Murphy's sign
reported.
IMPRESSION: 1. Essentially nonvisualized pancreas.
2. Liver borderline enlarged. The study is otherwise unremarkable.

[REDACTED]

## 2015-04-30 ENCOUNTER — Ambulatory Visit: Payer: Self-pay | Admitting: Family Medicine

## 2016-12-27 IMAGING — CT CT CERVICAL SPINE W/O CM
5 of 6 series · 15 of 33 positions shown, 17 images · non-contrast
Comparison: None.

CLINICAL DATA: Post fall backwards onto glass a door, now with
headache and neck pain. Bruising and blood noted about the left year
and left side of the neck. History of prior CVA.

EXAM:
CT HEAD WITHOUT CONTRAST
CT CERVICAL SPINE WITHOUT CONTRAST
TECHNIQUE: Multidetector CT imaging of the head and cervical spine was
performed following the standard protocol without intravenous
contrast. Multiplanar CT image reconstructions of the cervical spine
were also generated.

[Series 3: head bone · axial · 0.43mm/px · z∈[-106,-24]mm · 3 of 108 slices shown]
[im 27/108  bone]
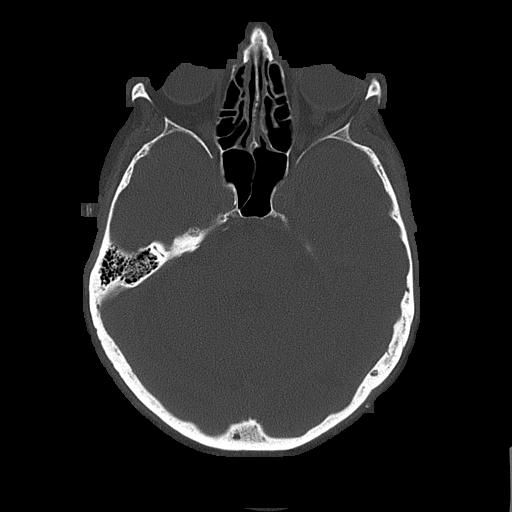
[im 54/108  bone]
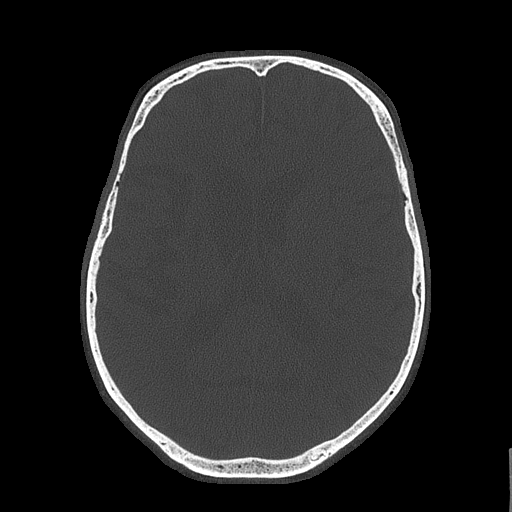
[im 81/108  bone]
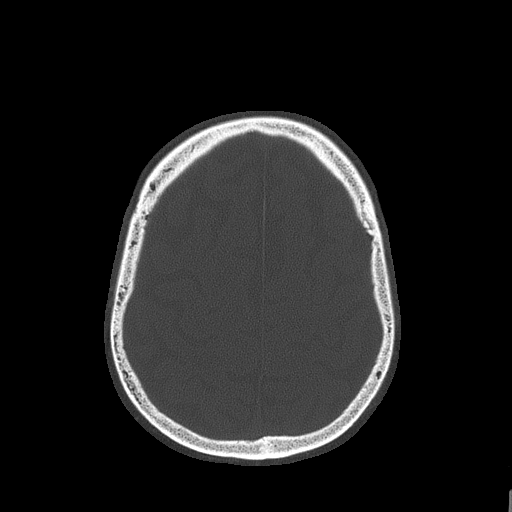

[Series 5: c spine soft · axial · 0.39mm/px · z∈[-244,-188]mm · 2 of 85 slices shown]
[im 29/85  soft-tissue]
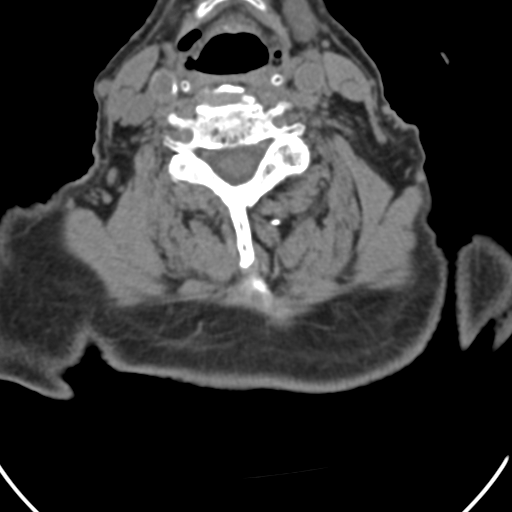
[im 57/85  soft-tissue]
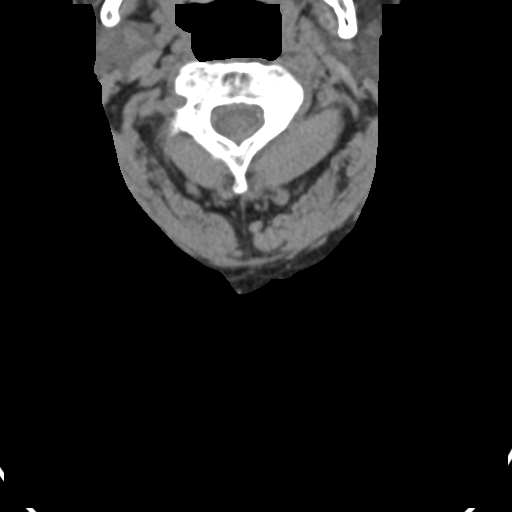

[Series 6: sag bone · sagittal · 0.35mm/px · 5 of 47 slices shown, 6 images]
[im 16/47  bone]
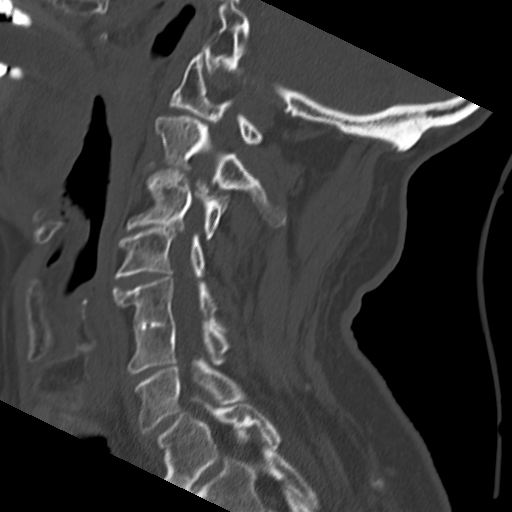
[im 20/47  bone]
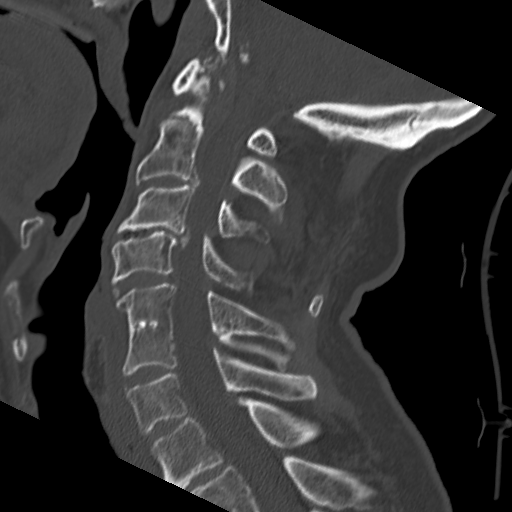
[im 24/47  soft-tissue]
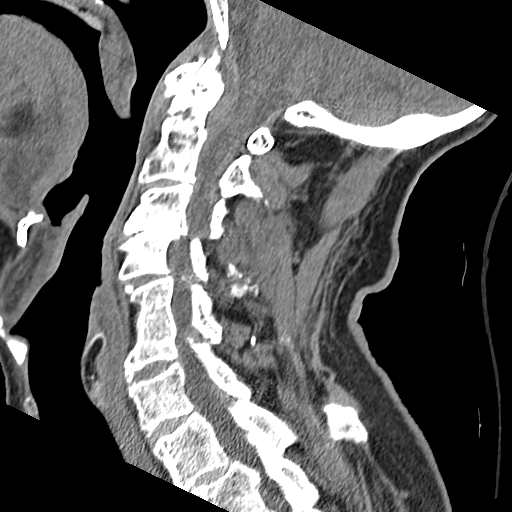
[im 24/47  bone]
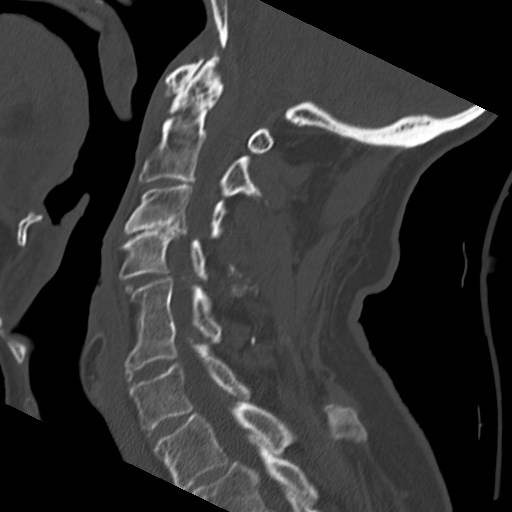
[im 27/47  bone]
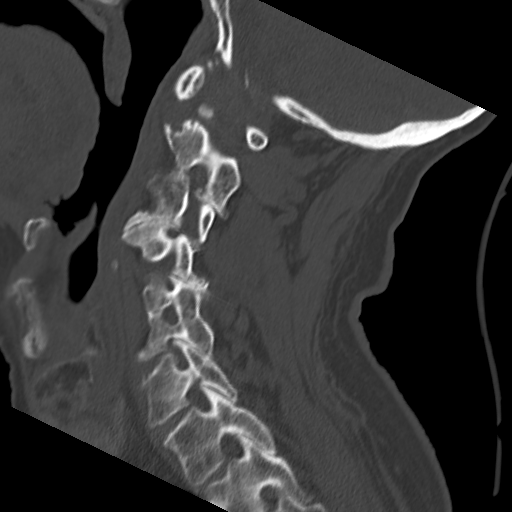
[im 31/47  bone]
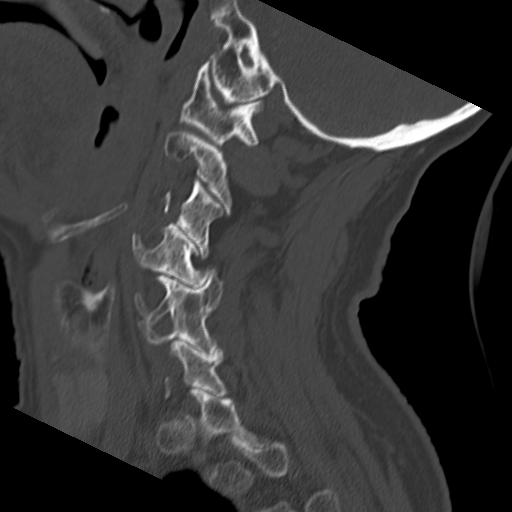

[Series 7: cor bone · coronal · 0.35mm/px · 3 of 55 slices shown]
[im 16/55  bone]
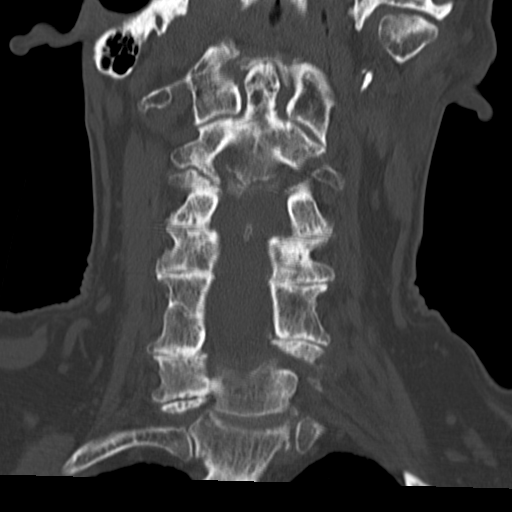
[im 25/55  bone]
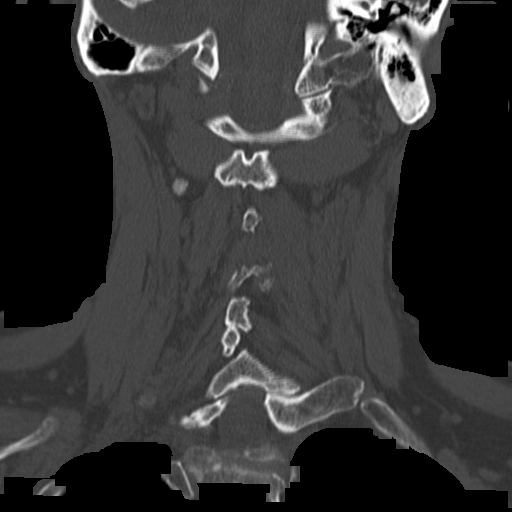
[im 35/55  bone]
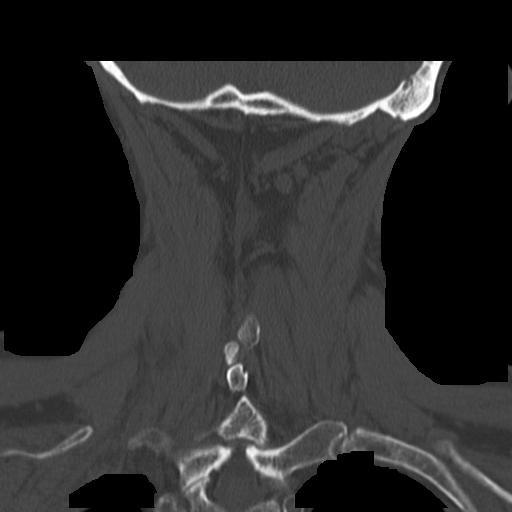

[Series 8: orthogonal axials · axial · 0.29mm/px · z∈[-277,-222]mm · 2 of 93 slices shown, 3 images]
[im 31/93  soft-tissue]
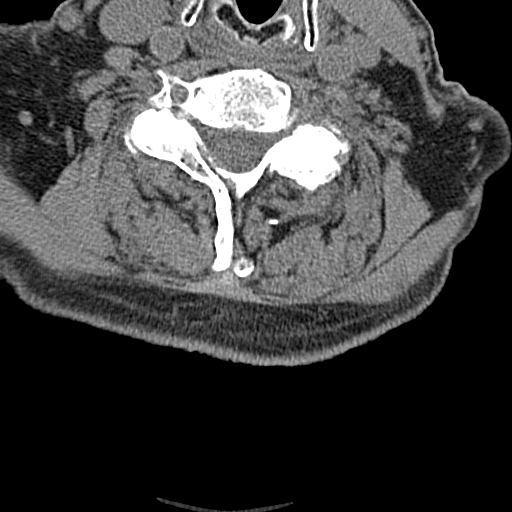
[im 31/93  bone]
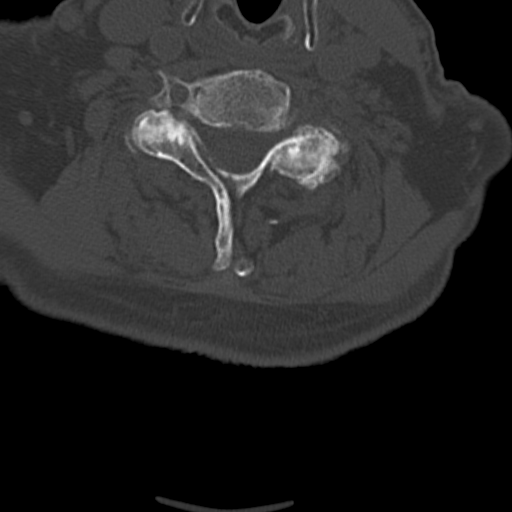
[im 62/93  bone]
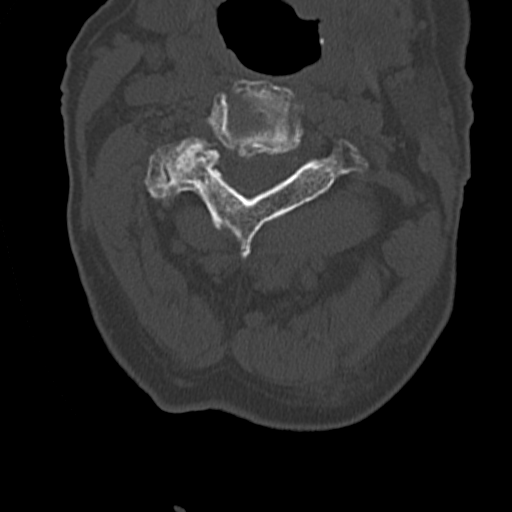

[15 of 33 positions shown; findings below may reference images not displayed]

FINDINGS: CT HEAD FINDINGS

Mild, likely age-appropriate atrophy with diffuse sulcal prominence.
Old large lacunar infarct involving the right basal ganglia (image
16, series 2). Extensive periventricular hypodensities compatible
with microvascular ischemic disease. Given extensive background
parenchymal abnormalities, there is no CT evidence of superimposed
acute large territory infarct. No intraparenchymal or extra-axial
mass or hemorrhage. Normal size a configuration of the ventricles
and basilar cisterns. No midline shift. Intracranial
atherosclerosis. Limited visualization of the paranasal sinuses and
mastoid air cells is normal.

Regional soft tissues appear normal with special attention paid to
the left year. No radiopaque foreign body. No displaced calvarial
fracture.

CT CERVICAL SPINE FINDINGS

C1 to the superior endplate of T2 is imaged.

There is a nondisplaced fracture involving the base of the dens. No
retropulsion. The dens remains normally position between the lateral
masses of C1. Mild to moderate atlantodental degenerative change.
Normal atlantoaxial articulations.

Cervical vertebral body heights are preserved. Prevertebral soft
tissues are normal.

Congenital ankylosis of the C5-C6 vertebral bodies with absence of
the left C6 pedicle.

Moderate to severe multilevel cervical spine DDD, worse at C3-C4
with disc space height loss, endplate irregularity and small
posteriorly directed disc osteophyte complex at this location. There
is partial ossification of the nuchal ligament posterior to the C4
spinous process.

There is extensive calcified plaque within the bilateral carotid
bulbs. No bulky cervical lymphadenopathy on this noncontrast
examination. Normal noncontrast appearance of the thyroid gland.
Limited visualization of lung apices is normal.
IMPRESSION: Cervical spine CT Impression:

1. Acute nondisplaced type 2 odontoid fracture without associated
retropulsion.
2. Congenital ankylosis of the C5-C6 vertebral bodies.
3. Moderate to severe multilevel cervical spine DDD, worse at C5-C6.
4. Extensive calcified plaque within the bilateral carotid bulbs.
Further evaluation with nonemergent carotid Doppler ultrasound could
be performed as clinically indicated.
Head CT Impression:

1. No acute intracranial process.
2. Advanced microvascular ischemic disease with old large right
basal ganglia infarct.
Critical Value/emergent results were called by telephone at the time
of interpretation on 03/19/2015 at [DATE] to Dr. YASMARA REINKI ,
who verbally acknowledged these results.
